# Patient Record
Sex: Male | Born: 1965 | Race: White | Hispanic: No | Marital: Married | Smoking: Never smoker
Health system: Southern US, Community
[De-identification: ages and names within clinical notes are randomized; demographics above are authoritative.]

---

## 2006-01-22 ENCOUNTER — Encounter: Admission: RE | Admit: 2006-01-22 | Discharge: 2006-01-22 | Payer: Self-pay | Admitting: Specialist

## 2006-02-20 ENCOUNTER — Encounter: Admission: RE | Admit: 2006-02-20 | Discharge: 2006-02-20 | Payer: Self-pay | Admitting: Orthopedic Surgery

## 2016-08-30 DIAGNOSIS — J329 Chronic sinusitis, unspecified: Secondary | ICD-10-CM | POA: Diagnosis not present

## 2019-01-11 ENCOUNTER — Emergency Department (HOSPITAL_COMMUNITY)
Admission: EM | Admit: 2019-01-11 | Discharge: 2019-01-11 | Disposition: A | Discharge status: Home / Self Care | Payer: Self-pay | Attending: Emergency Medicine | Admitting: Emergency Medicine

## 2019-01-11 ENCOUNTER — Encounter (HOSPITAL_COMMUNITY): Payer: Self-pay | Admitting: *Deleted

## 2019-01-11 ENCOUNTER — Other Ambulatory Visit: Payer: Self-pay

## 2019-01-11 ENCOUNTER — Emergency Department (HOSPITAL_COMMUNITY): Payer: Self-pay

## 2019-01-11 DIAGNOSIS — N201 Calculus of ureter: Secondary | ICD-10-CM | POA: Insufficient documentation

## 2019-01-11 DIAGNOSIS — N200 Calculus of kidney: Secondary | ICD-10-CM

## 2019-01-11 LAB — CBC WITH DIFFERENTIAL/PLATELET
Abs Immature Granulocytes: 0.05 10*3/uL (ref 0.00–0.07)
Basophils Absolute: 0.1 10*3/uL (ref 0.0–0.1)
Basophils Relative: 1 %
Eosinophils Absolute: 0.1 10*3/uL (ref 0.0–0.5)
Eosinophils Relative: 1 %
HCT: 46.5 % (ref 39.0–52.0)
Hemoglobin: 15.8 g/dL (ref 13.0–17.0)
Immature Granulocytes: 1 %
Lymphocytes Relative: 14 %
Lymphs Abs: 1.5 10*3/uL (ref 0.7–4.0)
MCH: 30 pg (ref 26.0–34.0)
MCHC: 34 g/dL (ref 30.0–36.0)
MCV: 88.4 fL (ref 80.0–100.0)
Monocytes Absolute: 0.9 10*3/uL (ref 0.1–1.0)
Monocytes Relative: 8 %
Neutro Abs: 8.5 10*3/uL — ABNORMAL HIGH (ref 1.7–7.7)
Neutrophils Relative %: 75 %
Platelets: 231 10*3/uL (ref 150–400)
RBC: 5.26 MIL/uL (ref 4.22–5.81)
RDW: 12.7 % (ref 11.5–15.5)
WBC: 11 10*3/uL — ABNORMAL HIGH (ref 4.0–10.5)
nRBC: 0 % (ref 0.0–0.2)

## 2019-01-11 LAB — COMPREHENSIVE METABOLIC PANEL
ALT: 31 U/L (ref 0–44)
AST: 21 U/L (ref 15–41)
Albumin: 4.1 g/dL (ref 3.5–5.0)
Alkaline Phosphatase: 53 U/L (ref 38–126)
Anion gap: 9 (ref 5–15)
BUN: 11 mg/dL (ref 6–20)
CO2: 23 mmol/L (ref 22–32)
Calcium: 9 mg/dL (ref 8.9–10.3)
Chloride: 106 mmol/L (ref 98–111)
Creatinine, Ser: 1.25 mg/dL — ABNORMAL HIGH (ref 0.61–1.24)
GFR calc Af Amer: >60 mL/min (ref >60)
GFR calc non Af Amer: >60 mL/min (ref >60)
Glucose, Bld: 199 mg/dL — ABNORMAL HIGH (ref 70–99)
Potassium: 3.7 mmol/L (ref 3.5–5.1)
Sodium: 138 mmol/L (ref 135–145)
Total Bilirubin: 0.9 mg/dL (ref 0.3–1.2)
Total Protein: 7.1 g/dL (ref 6.5–8.1)

## 2019-01-11 LAB — URINALYSIS, ROUTINE W REFLEX MICROSCOPIC
Bacteria, UA: NONE SEEN
Bilirubin Urine: NEGATIVE
Glucose, UA: NEGATIVE mg/dL
Ketones, ur: NEGATIVE mg/dL
Leukocytes,Ua: NEGATIVE
Nitrite: NEGATIVE
Protein, ur: 30 mg/dL — AB
RBC / HPF: >50 RBC/hpf — ABNORMAL HIGH (ref 0–5)
Specific Gravity, Urine: 1.023 (ref 1.005–1.030)
pH: 5 (ref 5.0–8.0)

## 2019-01-11 LAB — LIPASE, BLOOD: Lipase: 33 U/L (ref 11–51)

## 2019-01-11 MED ORDER — ONDANSETRON HCL 4 MG/2ML IJ SOLN
4.0000 mg | Freq: Once | INTRAMUSCULAR | Status: AC
  Administered 2019-01-11: 16:00:00 4 mg via INTRAVENOUS
  Filled 2019-01-11: qty 2

## 2019-01-11 MED ORDER — TRAMADOL HCL 50 MG PO TABS: 50.0000 mg | ORAL_TABLET | Freq: Four times a day (QID) | ORAL | 0 refills | Status: AC | PRN

## 2019-01-11 MED ORDER — FENTANYL CITRATE (PF) 100 MCG/2ML IJ SOLN
100.0000 ug | Freq: Once | INTRAMUSCULAR | Status: AC
Start: 2019-01-11 — End: 2019-01-11
  Administered 2019-01-11: 100 ug via INTRAVENOUS
  Filled 2019-01-11: qty 2

## 2019-01-11 MED ORDER — ONDANSETRON 4 MG PO TBDP: 4.0000 mg | ORAL_TABLET | Freq: Three times a day (TID) | ORAL | 0 refills | Status: AC | PRN

## 2019-01-11 MED ORDER — TAMSULOSIN HCL 0.4 MG PO CAPS: 0.4000 mg | ORAL_CAPSULE | Freq: Every day | ORAL | 0 refills | Status: AC

## 2019-01-11 MED ORDER — KETOROLAC TROMETHAMINE 30 MG/ML IJ SOLN
30.0000 mg | Freq: Once | INTRAMUSCULAR | Status: AC
  Administered 2019-01-11: 30 mg via INTRAVENOUS
  Filled 2019-01-11: qty 1

## 2019-01-11 NOTE — ED Triage Notes (Signed)
The pt is c/o lt flank pain since yesterday with nausea  No bloody urine  No frequency

## 2019-01-11 NOTE — ED Notes (Signed)
To ct

## 2019-01-11 NOTE — ED Notes (Signed)
Pt returned from c-t  Pain better then returned

## 2019-01-11 NOTE — ED Provider Notes (Signed)
[facility] Provider Note   CSN: [id number] Arrival date & time: 01/11/19  1459    History   Chief Complaint Chief Complaint  Patient presents with  . Flank Pain    HPI Porfirio MylarGreg Littlejohn is a 53 y.o. male.     HPI   53 year old male with no significant medical history presents with concern for left flank pain.  Reports he has had some episodes of left flank pain in the past that have resolved, however this morning at 4 AM woke up with more severe pain.  Reports that it has been waxing and waning throughout the day.  It is severe.  Initially it felt like an ax in his back, but now feels more like a steak knife.  Reports the pain is in his left lower flank, with radiation around to the left abdomen.  Nothing makes it better or worse.  He tried walking around to help, but that did not relieve it.  Took Aleve approximately an hour and 1/2 to 2 hours prior to arrival.  Reports associated nausea.  No vomiting, diarrhea, fevers, dysuria, hematuria, chest pain or shortness of breath.  Denies numbness or weakness, difficulty controlling bowel or bladder.  Denies a history of kidney stones, but does report that kidney stones run in his family.  History reviewed. No pertinent past medical history.  There are no active problems to display for this patient.   History reviewed. No pertinent surgical history.      Home Medications    Prior to Admission medications   Medication Sig Start Date End Date Taking? Authorizing Provider  ondansetron (ZOFRAN ODT) 4 MG disintegrating tablet Take 1 tablet (4 mg total) by mouth every 8 (eight) hours as needed for nausea or vomiting. 01/11/19   Alvira MondaySchlossman, Mohab Ashby, MD  tamsulosin (FLOMAX) 0.4 MG CAPS capsule Take 1 capsule (0.4 mg total) by mouth daily for 20 days. 01/11/19 01/31/19  Alvira MondaySchlossman, Parilee Hally, MD  traMADol (ULTRAM) 50 MG tablet Take 1 tablet (50 mg total) by mouth every 6 (six) hours as needed. 01/11/19   Alvira MondaySchlossman,  Gratia Disla, MD    Family History No family history on file.  Social History Social History   Tobacco Use  . Smoking status: Never Smoker  . Smokeless tobacco: Never Used  Substance Use Topics  . Alcohol use: Yes  . Drug use: Not on file     Allergies   Codeine   Review of Systems Review of Systems  Constitutional: Negative for fever.  HENT: Negative for sore throat.   Eyes: Negative for visual disturbance.  Respiratory: Negative for shortness of breath.   Cardiovascular: Negative for chest pain.  Gastrointestinal: Positive for abdominal pain and nausea. Negative for diarrhea and vomiting.  Genitourinary: Positive for flank pain. Negative for difficulty urinating.  Musculoskeletal: Positive for back pain. Negative for neck stiffness.  Skin: Negative for rash.  Neurological: Negative for syncope, weakness, numbness and headaches.     Physical Exam Updated Vital Signs BP (!) 150/94   Pulse 76   Temp 98.1 F (36.7 C)   Resp 18   Ht 6\' 1"  (1.854 m)   Wt 122.5 kg   SpO2 98%   BMI 35.62 kg/m   Physical Exam Vitals signs and nursing note reviewed.  Constitutional:      General: He is not in acute distress.    Appearance: He is well-developed. He is not diaphoretic.  HENT:     Head: Normocephalic and atraumatic.  Eyes:  Conjunctiva/sclera: Conjunctivae normal.  Neck:     Musculoskeletal: Normal range of motion.  Cardiovascular:     Rate and Rhythm: Normal rate and regular rhythm.     Heart sounds: Normal heart sounds.  Pulmonary:     Effort: Pulmonary effort is normal. No respiratory distress.  Abdominal:     General: There is no distension.     Palpations: Abdomen is soft.     Tenderness: There is no abdominal tenderness. There is no guarding.     Comments: Left lower flank/back tenderness  Skin:    General: Skin is warm and dry.  Neurological:     Mental Status: He is alert and oriented to person, place, and time.      ED Treatments / Results   Labs (all labs ordered are listed, but only abnormal results are displayed) Labs Reviewed  CBC WITH DIFFERENTIAL/PLATELET - Abnormal; Notable for the following components:      Result Value   WBC 11.0 (*)    Neutro Abs 8.5 (*)    All other components within normal limits  COMPREHENSIVE METABOLIC PANEL - Abnormal; Notable for the following components:   Glucose, Bld 199 (*)    Creatinine, Ser 1.25 (*)    All other components within normal limits  URINALYSIS, ROUTINE W REFLEX MICROSCOPIC - Abnormal; Notable for the following components:   APPearance HAZY (*)    Hgb urine dipstick LARGE (*)    Protein, ur 30 (*)    RBC / HPF >50 (*)    All other components within normal limits  URINE CULTURE  LIPASE, BLOOD    EKG None  Radiology Ct Renal Stone Study  Result Date: 01/11/2019 CLINICAL DATA:  Left flank pain EXAM: CT ABDOMEN AND PELVIS WITHOUT CONTRAST TECHNIQUE: Multidetector CT imaging of the abdomen and pelvis was performed following the standard protocol without IV contrast. COMPARISON:  None. FINDINGS: Lower chest: No acute abnormality. Hepatobiliary: No solid liver abnormality is seen. Hepatic steatosis. No gallstones, gallbladder wall thickening, or biliary dilatation. Pancreas: Unremarkable. No pancreatic ductal dilatation or surrounding inflammatory changes. Spleen: Normal in size without significant abnormality. Adrenals/Urinary Tract: Adrenal glands are unremarkable. There is a 2 mm calculus of the middle third of the left ureter with mild associated hydronephrosis and hydroureter. Bladder is unremarkable. Stomach/Bowel: Stomach is within normal limits. Appendix appears normal. No evidence of bowel wall thickening, distention, or inflammatory changes. Sigmoid diverticulosis. Vascular/Lymphatic: No significant vascular findings are present. No enlarged abdominal or pelvic lymph nodes. Reproductive: No mass or other significant abnormality. Other: Small fat containing bilateral  inguinal hernias. No abdominopelvic ascites. Musculoskeletal: No acute or significant osseous findings. IMPRESSION: 1. There is a 2 mm calculus of the middle third of the left ureter with mild associated hydronephrosis and hydroureter. No other evidence of urinary tract calculus. 2.  Other chronic and incidental findings as detailed above. Electronically Signed   By: Eddie Candle M.D.   On: 01/11/2019 16:30    Procedures Procedures (including critical care time)  Medications Ordered in ED Medications  fentaNYL (SUBLIMAZE) injection 100 mcg (100 mcg Intravenous Given 01/11/19 1552)  ondansetron (ZOFRAN) injection 4 mg (4 mg Intravenous Given 01/11/19 1551)  ketorolac (TORADOL) 30 MG/ML injection 30 mg (30 mg Intravenous Given 01/11/19 1640)     Initial Impression / Assessment and Plan / ED Course  I have reviewed the triage vital signs and the nursing notes.  Pertinent labs & imaging results that were available during my care of the patient were  reviewed by me and considered in my medical decision making (see chart for details).        [age] year old male with no significant medical history presents with concern for left flank pain.  Differential diagnosis includes nephrolithiasis, pyelonephritis, diverticulitis.  CT stone study completed showing a 2 mm calculus in the middle third of the left ureter with mild associated hydronephrosis.  No prior renal function for comparison, however GFR is greater than 60.  Urinalysis shows no evidence of urinary tract infection.  Pain controlled after medications in the emergency department.  Recommend hydration, Flomax, pain and nausea control as well as urology follow-up.  [facility]  Given prescription for tramadol, as well as recommendation to use ibuprofen and Tylenol for pain. Patient discharged in stable condition with understanding of reasons to return.   Final Clinical Impressions(s) / ED Diagnoses   Final  diagnoses:  Nephrolithiasis  Left ureteral stone    ED Discharge Orders         Ordered    traMADol (ULTRAM) 50 MG tablet  Every 6 hours PRN     01/11/19 1701    ondansetron (ZOFRAN ODT) 4 MG disintegrating tablet  Every 8 hours PRN     01/11/19 1701    tamsulosin (FLOMAX) 0.4 MG CAPS capsule  Daily     01/11/19 1701           Alvira MondaySchlossman, Deanette Tullius, MD 01/11/19 1706

## 2019-01-11 NOTE — Discharge Instructions (Signed)
Take Tylenol 1000 mg 4 times a day for 1 week. This is the maximum dose of Tylenol (acetaminophen) you can take from all sources. Please check other over-the-counter medications and prescriptions to ensure you are not taking other medications that contain acetaminophen.  You may also take ibuprofen 400 mg 6 times a day alternating with or at the same time as tylenol.  Take tramadol as needed for breakthrough pain.  This medication can be addicting, sedating and cause constipation.   °

## 2019-01-11 NOTE — ED Notes (Signed)
Micheal Mills [phone]

## 2019-01-12 LAB — URINE CULTURE: Culture: NO GROWTH

## 2019-08-31 ENCOUNTER — Ambulatory Visit: Payer: Self-pay | Attending: Internal Medicine

## 2019-08-31 DIAGNOSIS — Z20822 Contact with and (suspected) exposure to covid-19: Secondary | ICD-10-CM

## 2019-08-31 DIAGNOSIS — U071 COVID-19: Secondary | ICD-10-CM | POA: Insufficient documentation

## 2019-09-01 ENCOUNTER — Ambulatory Visit: Payer: Self-pay | Attending: Internal Medicine

## 2019-09-01 LAB — NOVEL CORONAVIRUS, NAA: SARS-CoV-2, NAA: DETECTED — AB

## 2019-09-02 ENCOUNTER — Telehealth: Payer: Self-pay | Admitting: Nurse Practitioner

## 2019-09-02 ENCOUNTER — Telehealth: Payer: Self-pay | Admitting: Internal Medicine

## 2019-09-02 NOTE — Telephone Encounter (Signed)
Called to discuss with patient about Covid symptoms and the use of bamlanivimab, a monoclonal antibody infusion for those with mild to moderate Covid symptoms and at high risk of hospitalization.   Pt appears to qualify for this infusion at [facility] infusion center due to co-morbid conditions and/or member of at risk group. Pt with BMI >35 in 2020. Will need to verify and determine onset of symptoms.   Unable to reach pt. Left MyChart message.   Cyndee Brightly, NP-C [facility]

## 2019-09-02 NOTE — Telephone Encounter (Signed)
Called and followed up with patient regarding monoclonal infusion. Patient reports he is day 6 of his symptoms and feels as though they are beginning to subside. He is feeling much better compared to 4 days ago. Declines infusion at this time, with hopes he continues to feel better in the upcoming days.   He is aware of quarantine period and to seek assistance if symptoms return or worsen.

## 2019-09-07 ENCOUNTER — Telehealth: Payer: Self-pay | Admitting: Internal Medicine

## 2019-09-07 NOTE — Telephone Encounter (Signed)
Reached out to pt previously x 2 regarding monoclonal antibody infusion for Covid treatment. Pt qualifies based on BMI >35, but declined infusion. Message sent to me today that pt inquiring where to go to have O2 levels checked "just to be safe". Based on previous notes, pt is outside of window for Mab infusion, but would need to verify. No answer. Message left with pt to call on my cell phone to help further assess needs.   Cyndee Brightly, NP-C [facility]

## 2019-10-30 IMAGING — CT CT RENAL STONE PROTOCOL
2 of 4 series · 16 of 46 positions shown, 18 images · non-contrast
Comparison: None.

CLINICAL DATA: Left flank pain

EXAM:
CT ABDOMEN AND PELVIS WITHOUT CONTRAST
TECHNIQUE: Multidetector CT imaging of the abdomen and pelvis was performed
following the standard protocol without IV contrast.

[Series 3: stone study 5.0 i30f 2 · axial · 0.98mm/px · z∈[+779,+1289]mm · 13 of 112 slices shown, 15 images]
[im 5/112  soft-tissue]
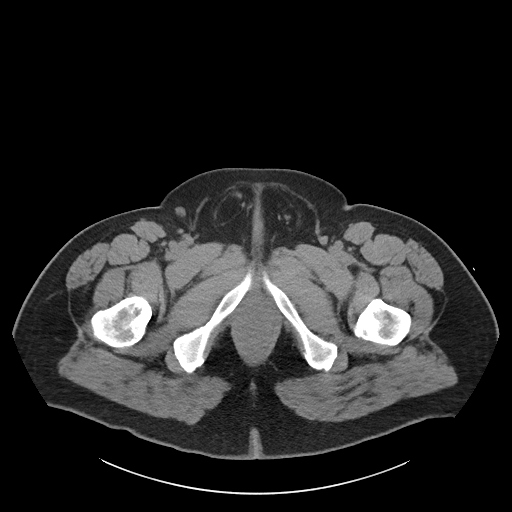
[im 5/112  bone]
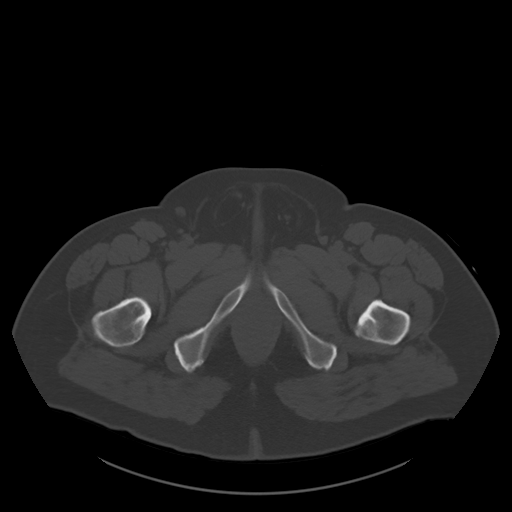
[im 15/112  soft-tissue]
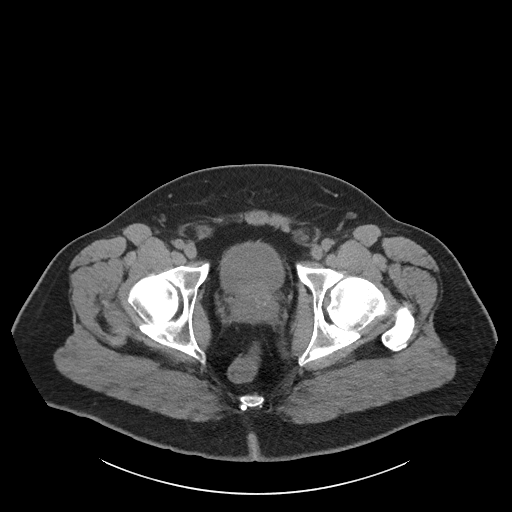
[im 25/112  soft-tissue]
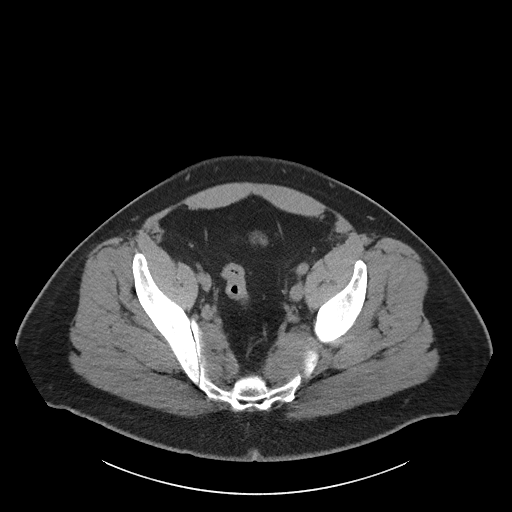
[im 29/112  soft-tissue]
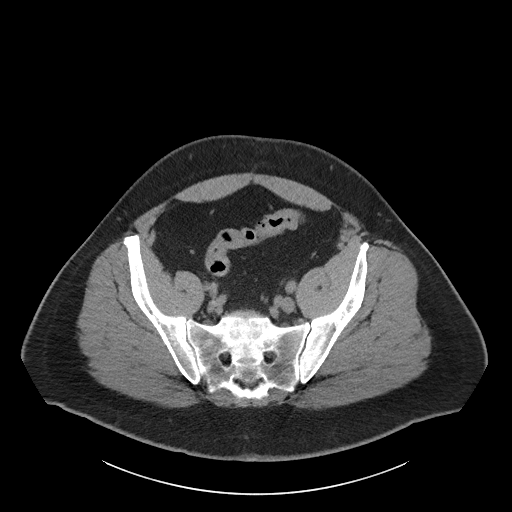
[im 39/112  soft-tissue]
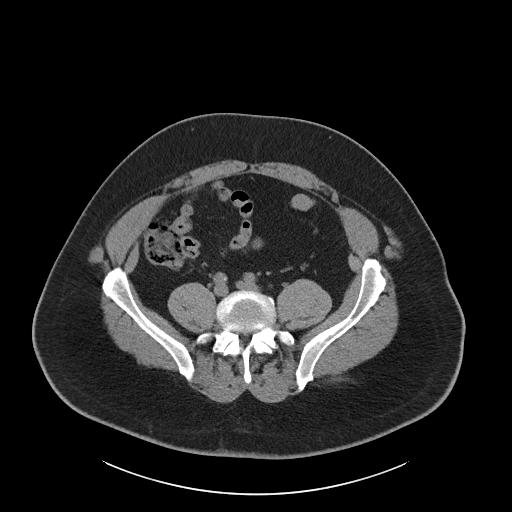
[im 49/112  soft-tissue]
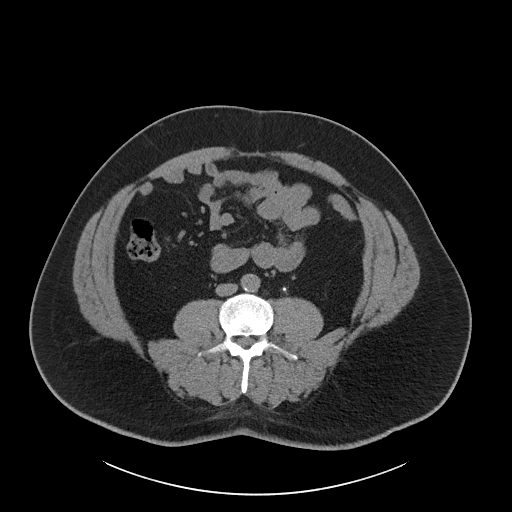
[im 58/112  soft-tissue]
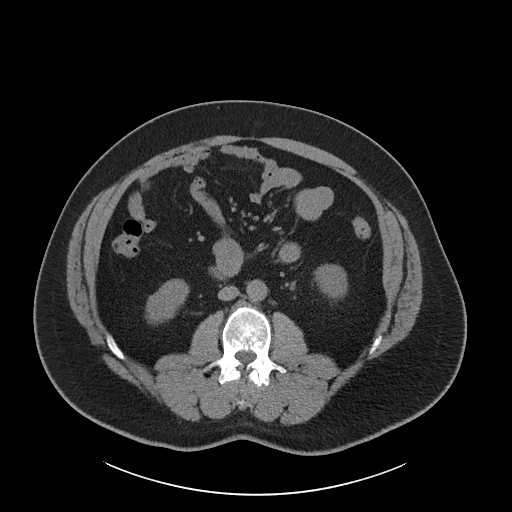
[im 63/112  soft-tissue]
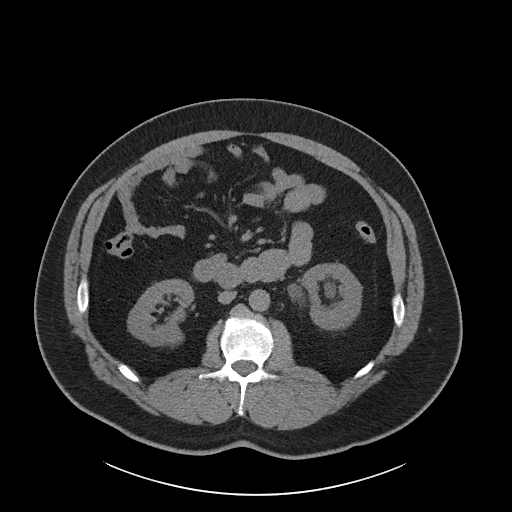
[im 73/112  soft-tissue]
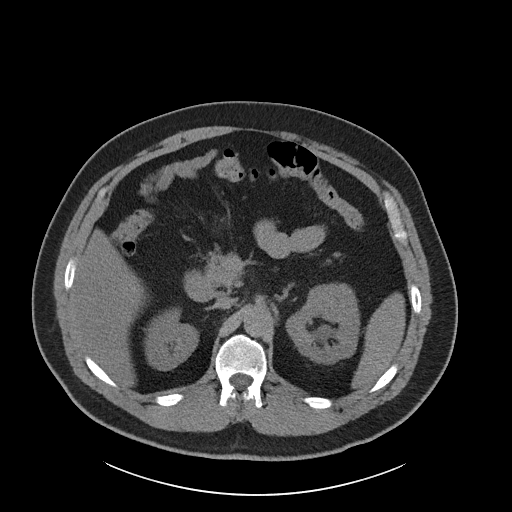
[im 73/112  bone]
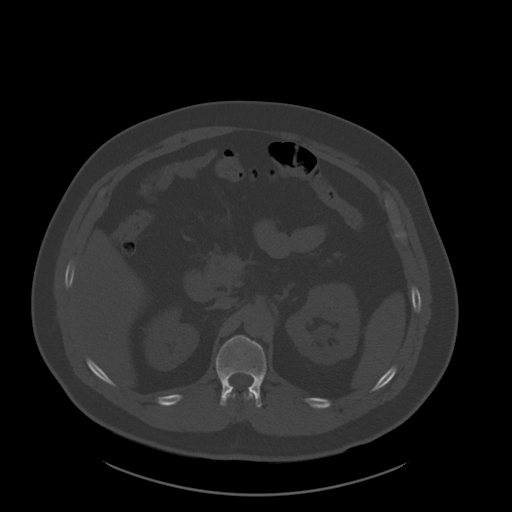
[im 83/112  soft-tissue]
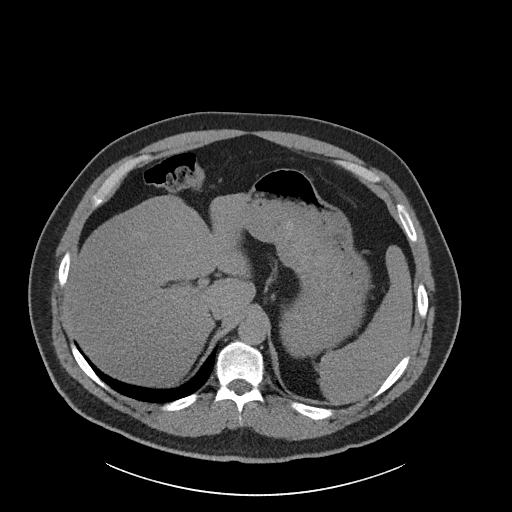
[im 87/112  soft-tissue]
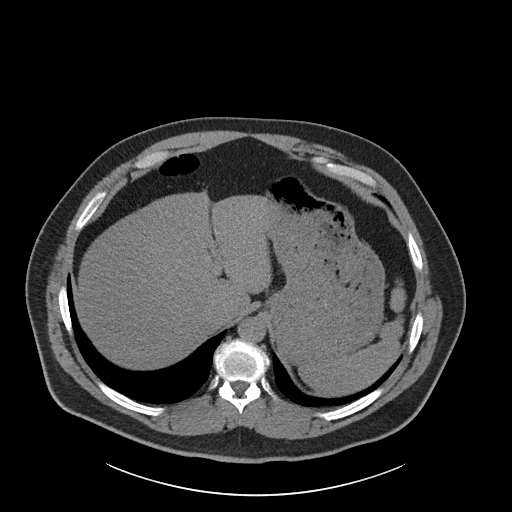
[im 97/112  soft-tissue]
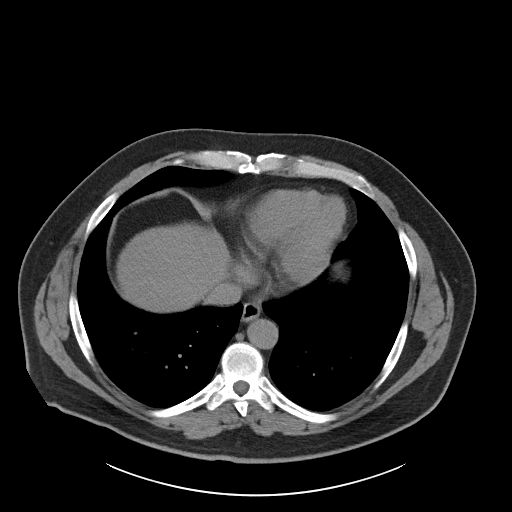
[im 107/112  soft-tissue]
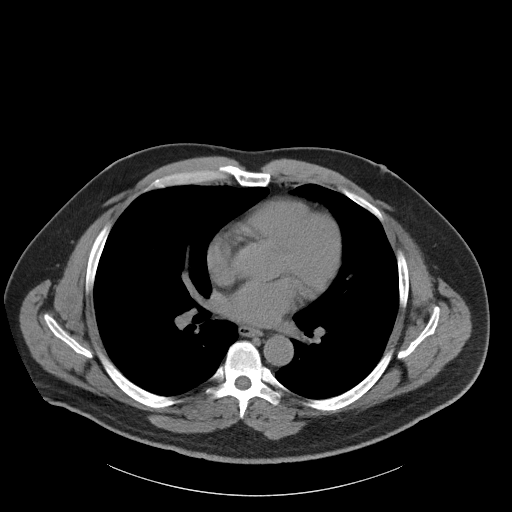

[Series 6: coronal soft tissue · coronal · 0.98mm/px · 3 of 139 slices shown]
[im 47/139  soft-tissue]
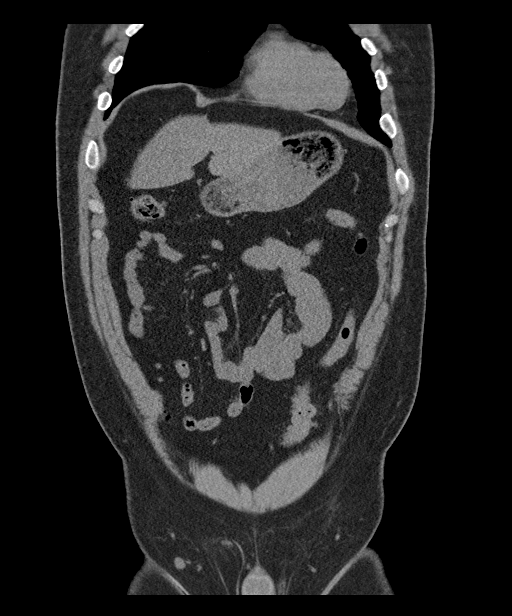
[im 62/139  soft-tissue]
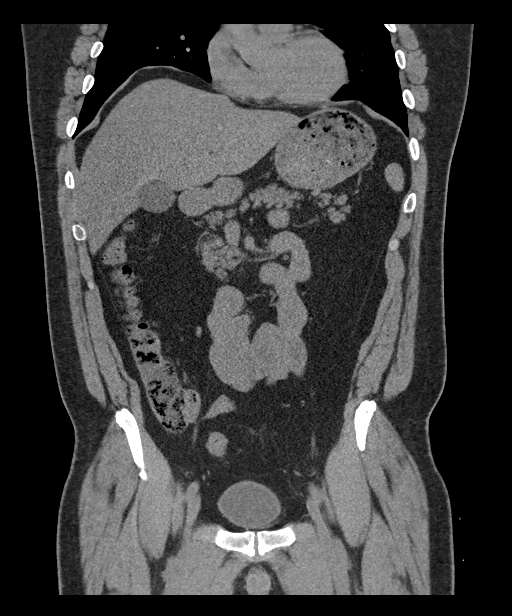
[im 77/139  soft-tissue]
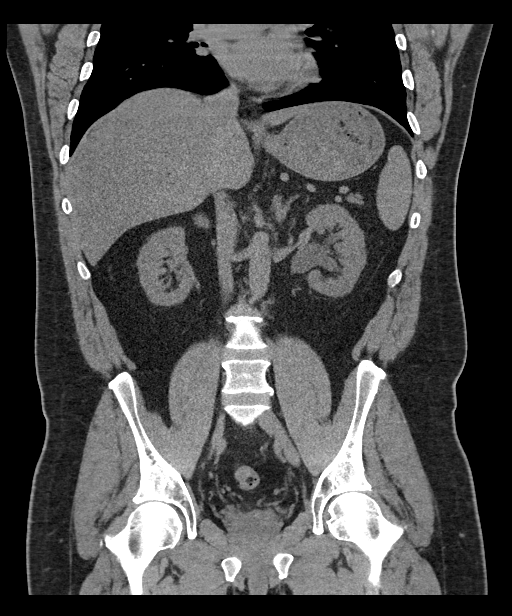

[16 of 46 positions shown; findings below may reference images not displayed]

FINDINGS: Lower chest: No acute abnormality.

Hepatobiliary: No solid liver abnormality is seen. Hepatic
steatosis. No gallstones, gallbladder wall thickening, or biliary
dilatation.

Pancreas: Unremarkable. No pancreatic ductal dilatation or
surrounding inflammatory changes.

Spleen: Normal in size without significant abnormality.

Adrenals/Urinary Tract: Adrenal glands are unremarkable. There is a
2 mm calculus of the middle third of the left ureter with mild
associated hydronephrosis and hydroureter. Bladder is unremarkable.

Stomach/Bowel: Stomach is within normal limits. Appendix appears
normal. No evidence of bowel wall thickening, distention, or
inflammatory changes. Sigmoid diverticulosis.

Vascular/Lymphatic: No significant vascular findings are present. No
enlarged abdominal or pelvic lymph nodes.

Reproductive: No mass or other significant abnormality.

Other: Small fat containing bilateral inguinal hernias. No
abdominopelvic ascites.

Musculoskeletal: No acute or significant osseous findings.
IMPRESSION: 1. There is a 2 mm calculus of the middle third of the left ureter
with mild associated hydronephrosis and hydroureter. No other
evidence of urinary tract calculus.

2.  Other chronic and incidental findings as detailed above.
